# Patient Record
Sex: Female | Born: 1975 | Race: Black or African American | Hispanic: No | Marital: Single | Smoking: Current every day smoker
Health system: Southern US, Community
[De-identification: ages and names within clinical notes are randomized; demographics above are authoritative.]

## PROBLEM LIST (undated history)

## (undated) DIAGNOSIS — E7800 Pure hypercholesterolemia, unspecified: Secondary | ICD-10-CM

## (undated) DIAGNOSIS — F410 Panic disorder [episodic paroxysmal anxiety] without agoraphobia: Secondary | ICD-10-CM

## (undated) DIAGNOSIS — F329 Major depressive disorder, single episode, unspecified: Secondary | ICD-10-CM

## (undated) DIAGNOSIS — F32A Depression, unspecified: Secondary | ICD-10-CM

## (undated) DIAGNOSIS — R7303 Prediabetes: Secondary | ICD-10-CM

## (undated) DIAGNOSIS — I10 Essential (primary) hypertension: Secondary | ICD-10-CM

## (undated) DIAGNOSIS — F419 Anxiety disorder, unspecified: Secondary | ICD-10-CM

## (undated) HISTORY — PX: ABDOMINAL HYSTERECTOMY: SHX81

## (undated) HISTORY — PX: KNEE ARTHROSCOPY: SUR90

## (undated) HISTORY — PX: BREAST SURGERY: SHX581

---

## 2018-03-02 ENCOUNTER — Emergency Department (HOSPITAL_COMMUNITY): Payer: Medicaid Other

## 2018-03-02 ENCOUNTER — Encounter (HOSPITAL_COMMUNITY): Payer: Self-pay | Admitting: Emergency Medicine

## 2018-03-02 ENCOUNTER — Emergency Department (HOSPITAL_COMMUNITY)
Admission: EM | Admit: 2018-03-02 | Discharge: 2018-03-02 | Disposition: A | Discharge status: Home / Self Care | Payer: Medicaid Other | Attending: Emergency Medicine | Admitting: Emergency Medicine

## 2018-03-02 ENCOUNTER — Other Ambulatory Visit: Payer: Self-pay

## 2018-03-02 DIAGNOSIS — K009 Disorder of tooth development, unspecified: Secondary | ICD-10-CM | POA: Insufficient documentation

## 2018-03-02 DIAGNOSIS — K0889 Other specified disorders of teeth and supporting structures: Secondary | ICD-10-CM

## 2018-03-02 DIAGNOSIS — I10 Essential (primary) hypertension: Secondary | ICD-10-CM | POA: Insufficient documentation

## 2018-03-02 DIAGNOSIS — F17210 Nicotine dependence, cigarettes, uncomplicated: Secondary | ICD-10-CM | POA: Diagnosis not present

## 2018-03-02 HISTORY — DX: Major depressive disorder, single episode, unspecified: F32.9

## 2018-03-02 HISTORY — DX: Essential (primary) hypertension: I10

## 2018-03-02 HISTORY — DX: Panic disorder (episodic paroxysmal anxiety): F41.0

## 2018-03-02 HISTORY — DX: Prediabetes: R73.03

## 2018-03-02 HISTORY — DX: Anxiety disorder, unspecified: F41.9

## 2018-03-02 HISTORY — DX: Depression, unspecified: F32.A

## 2018-03-02 HISTORY — DX: Pure hypercholesterolemia, unspecified: E78.00

## 2018-03-02 MED ORDER — IBUPROFEN 800 MG PO TABS
800.0000 mg | ORAL_TABLET | Freq: Once | ORAL | Status: AC
  Administered 2018-03-02: 800 mg via ORAL
  Filled 2018-03-02: qty 1

## 2018-03-02 MED ORDER — IBUPROFEN 600 MG PO TABS: 600.0000 mg | ORAL_TABLET | Freq: Four times a day (QID) | ORAL | 0 refills | Status: AC

## 2018-03-02 MED ORDER — CLINDAMYCIN HCL 150 MG PO CAPS: 150.0000 mg | ORAL_CAPSULE | Freq: Four times a day (QID) | ORAL | 0 refills | Status: AC

## 2018-03-02 MED ORDER — CLINDAMYCIN HCL 150 MG PO CAPS
300.0000 mg | ORAL_CAPSULE | Freq: Once | ORAL | Status: AC
  Administered 2018-03-02: 300 mg via ORAL
  Filled 2018-03-02: qty 2

## 2018-03-02 MED ORDER — HYDROCODONE-ACETAMINOPHEN 5-325 MG PO TABS: 1.0000 | ORAL_TABLET | ORAL | 0 refills | Status: AC | PRN

## 2018-03-02 MED ORDER — ACETAMINOPHEN 500 MG PO TABS
1000.0000 mg | ORAL_TABLET | Freq: Once | ORAL | Status: AC
  Administered 2018-03-02: 1000 mg via ORAL
  Filled 2018-03-02: qty 2

## 2018-03-02 NOTE — Discharge Instructions (Signed)
Your vital signs have been reviewed.  Your oxygen level is 100% on room air.  Within normal limits by my interpretation.  Your CT scan shows that there is some fluid in the area where that you had the tooth removed.  There is no evidence of surrounding abscess at this time.  Please use clindamycin and ibuprofen with breakfast, lunch, dinner, and at bedtime.  Please use Norco for more severe pain.  This medication may cause drowsiness.  Please use it with caution.  Please see a local dentist as soon as possible concerning your facial pain and your dental pain.

## 2018-03-02 NOTE — ED Triage Notes (Signed)
Patient c/o left jaw pain that radiates into face. Per patient had tooth pulled 1 week and 2 days ago after cracking it while eating a jolly rancher. Patient states now unable to eat anything due to pain. Patient taking tylenol and percocet with no relief.

## 2018-03-02 NOTE — ED Provider Notes (Signed)
[facility] EMERGENCY DEPARTMENT Provider Note   CSN: [id number] Arrival date & time: 03/02/18  1248     History   Chief Complaint Chief Complaint  Patient presents with  . Jaw Pain    HPI Pamela West is a 42 y.o. female.  Patient is a 42 year old female who presents to the emergency department with a complaint of left jaw pain.  The patient states that approximately a week ago she had a molar removed.  [place]  Since she is been here she has been having pain and swelling at the area where the tooth was extracted, she is also had pain and tightness in her face.  She does not have any difficulty with breathing, she has problems with eating and chewing, but not swallowing.  She has taken Tylenol and ibuprofen without any significant effect.  She is tried ice, and she is tried some salt water swishes, but these are not helping.  She denies fever or chills.  She presents now for assistance with this issue.  The history is provided by the patient.    Past Medical History:  Diagnosis Date  . Anxiety   . Borderline diabetes   . Depression   . High cholesterol   . Hypertension   . Panic attack     There are no active problems to display for this patient.   Past Surgical History:  Procedure Laterality Date  . ABDOMINAL HYSTERECTOMY    . BREAST SURGERY    . KNEE ARTHROSCOPY       OB History   None      Home Medications    Prior to Admission medications   Not on File    Family History Family History  Problem Relation Age of Onset  . Hyperlipidemia Mother   . Hypertension Mother   . Hypertension Father   . Hyperlipidemia Father     Social History Social History   Tobacco Use  . Smoking status: Current Every Day Smoker    Packs/day: 1.00    Types: Cigarettes  . Smokeless tobacco: Never Used  Substance Use Topics  . Alcohol use: Never    Frequency: Never  . Drug use: Yes    Types: Marijuana     Allergies   Patient has no  known allergies.   Review of Systems Review of Systems  Constitutional: Negative for activity change.       All ROS Neg except as noted in HPI  HENT: Positive for dental problem and facial swelling. Negative for nosebleeds.   Eyes: Negative for photophobia and discharge.  Respiratory: Negative for cough, shortness of breath and wheezing.   Cardiovascular: Negative for chest pain and palpitations.  Gastrointestinal: Negative for abdominal pain and blood in stool.  Genitourinary: Negative for dysuria, frequency and hematuria.  Musculoskeletal: Negative for arthralgias, back pain and neck pain.  Skin: Negative.   Neurological: Negative for dizziness, seizures and speech difficulty.  Psychiatric/Behavioral: Negative for confusion and hallucinations.     Physical Exam Updated Vital Signs BP (!) 147/99 (BP Location: Right Arm)   Pulse 99   Temp 98.4 F (36.9 C) (Oral)   Resp 18   Ht 5\' 2"  (1.575 m)   Wt 72.6 kg   SpO2 100%   BMI 29.26 kg/m   Physical Exam  Constitutional: She is oriented to person, place, and time. She appears well-developed and well-nourished.  Non-toxic appearance.  HENT:  Head: Normocephalic.  Right Ear: Tympanic membrane and external  ear normal.  Left Ear: Tympanic membrane and external ear normal.  There is pain and swelling of the gum at the left upper molar area.  There is swelling around the gum area.  No visible abscess noted.  Patient has had a recent extraction in this area.  The airway is patent.  The uvula is in the midline.  There is no swelling under the tongue.  There is no trismus noted.  There is pain to palpation along the left face extending up to the temporomandibular joint.  There is no crepitus or deformity or problem or increased warmth of the temporomandibular joint.  Eyes: Pupils are equal, round, and reactive to light. EOM and lids are normal.  Neck: Normal range of motion. Neck supple. Carotid bruit is not present.  Cardiovascular:  Normal rate, regular rhythm, normal heart sounds, intact distal pulses and normal pulses.  Pulmonary/Chest: Breath sounds normal. No respiratory distress.  Abdominal: Soft. Bowel sounds are normal. There is no tenderness. There is no guarding.  Musculoskeletal: Normal range of motion.  Lymphadenopathy:       Head (right side): No submandibular adenopathy present.       Head (left side): No submandibular adenopathy present.    She has no cervical adenopathy.  Neurological: She is alert and oriented to person, place, and time. She has normal strength. No cranial nerve deficit or sensory deficit.  Skin: Skin is warm and dry.  Psychiatric: She has a normal mood and affect. Her speech is normal.  Nursing note and vitals reviewed.    ED Treatments / Results  Labs (all labs ordered are listed, but only abnormal results are displayed) Labs Reviewed - No data to display  EKG None  Radiology No results found.  Procedures Procedures (including critical care time)  Medications Ordered in ED Medications - No data to display   Initial Impression / Assessment and Plan / ED Course  I have reviewed the triage vital signs and the nursing notes.  Pertinent labs & imaging results that were available during my care of the patient were reviewed by me and considered in my medical decision making (see chart for details).       Final Clinical Impressions(s) / ED Diagnoses MDM  Vital signs reviewed.  Pulse oximetry is 100% on room air.  Within normal limits by my interpretation.  The patient has pain and swelling of the left posterior gum area.  There is no evidence for Ludewig's angina at this time, or other emergent changes.  The patient will have a CT scan of the maxillofacial area for further evaluation of possible abscess, or bone involvement following tooth extraction.  CT maxillofacial scanning reveals dental extraction at tooth #15 with a small amount of fluid and air at the extraction  bed.  There is some nonspecific mixed lucencies and calcific density of the mandible.  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  I have discussed the findings of the exam as well as the CT scan with the patient in terms which she understands.  The patient will will start on clindamycin, ibuprofen, and hydrocodone.  I have asked the patient to see a local dentist as soon as possible for evaluation of her pain and swelling following the extraction.  Patient is in agreement with this plan.   Final diagnoses:  Pain, dental  Dental anomaly    ED Discharge Orders         Ordered    clindamycin (CLEOCIN) 150 MG  capsule  Every 6 hours     03/02/18 1435    ibuprofen (ADVIL,MOTRIN) 600 MG tablet  4 times daily     03/02/18 1435    HYDROcodone-acetaminophen (NORCO/VICODIN) 5-325 MG tablet  Every 4 hours PRN     03/02/18 1435           Ivery Quale, PA-C 03/02/18 1455    Terrilee Files, MD 03/02/18 [phone]

## 2019-02-16 ENCOUNTER — Emergency Department (HOSPITAL_COMMUNITY): Payer: Medicaid Other

## 2019-02-16 ENCOUNTER — Encounter (HOSPITAL_COMMUNITY): Payer: Self-pay

## 2019-02-16 ENCOUNTER — Emergency Department (HOSPITAL_COMMUNITY)
Admission: EM | Admit: 2019-02-16 | Discharge: 2019-02-16 | Disposition: A | Discharge status: Home / Self Care | Payer: Medicaid Other | Attending: Emergency Medicine | Admitting: Emergency Medicine

## 2019-02-16 ENCOUNTER — Other Ambulatory Visit: Payer: Self-pay

## 2019-02-16 DIAGNOSIS — I10 Essential (primary) hypertension: Secondary | ICD-10-CM | POA: Diagnosis not present

## 2019-02-16 DIAGNOSIS — S0990XA Unspecified injury of head, initial encounter: Secondary | ICD-10-CM | POA: Insufficient documentation

## 2019-02-16 DIAGNOSIS — T424X1A Poisoning by benzodiazepines, accidental (unintentional), initial encounter: Secondary | ICD-10-CM | POA: Diagnosis not present

## 2019-02-16 DIAGNOSIS — R079 Chest pain, unspecified: Secondary | ICD-10-CM | POA: Diagnosis not present

## 2019-02-16 DIAGNOSIS — R270 Ataxia, unspecified: Secondary | ICD-10-CM | POA: Diagnosis not present

## 2019-02-16 DIAGNOSIS — F17210 Nicotine dependence, cigarettes, uncomplicated: Secondary | ICD-10-CM | POA: Diagnosis not present

## 2019-02-16 DIAGNOSIS — Y999 Unspecified external cause status: Secondary | ICD-10-CM | POA: Insufficient documentation

## 2019-02-16 DIAGNOSIS — Y939 Activity, unspecified: Secondary | ICD-10-CM | POA: Diagnosis not present

## 2019-02-16 DIAGNOSIS — R319 Hematuria, unspecified: Secondary | ICD-10-CM | POA: Insufficient documentation

## 2019-02-16 DIAGNOSIS — T50901A Poisoning by unspecified drugs, medicaments and biological substances, accidental (unintentional), initial encounter: Secondary | ICD-10-CM

## 2019-02-16 DIAGNOSIS — Y929 Unspecified place or not applicable: Secondary | ICD-10-CM | POA: Diagnosis not present

## 2019-02-16 LAB — I-STAT CHEM 8, ED
BUN: 18 mg/dL (ref 6–20)
Calcium, Ion: 1.13 mmol/L — ABNORMAL LOW (ref 1.15–1.40)
Chloride: 102 mmol/L (ref 98–111)
Creatinine, Ser: 0.9 mg/dL (ref 0.44–1.00)
Glucose, Bld: 89 mg/dL (ref 70–99)
HCT: 44 % (ref 36.0–46.0)
Hemoglobin: 15 g/dL (ref 12.0–15.0)
Potassium: 5.7 mmol/L — ABNORMAL HIGH (ref 3.5–5.1)
Sodium: 138 mmol/L (ref 135–145)
TCO2: 30 mmol/L (ref 22–32)

## 2019-02-16 LAB — COMPREHENSIVE METABOLIC PANEL
ALT: 27 U/L (ref 0–44)
AST: 20 U/L (ref 15–41)
Albumin: 4.1 g/dL (ref 3.5–5.0)
Alkaline Phosphatase: 91 U/L (ref 38–126)
Anion gap: 9 (ref 5–15)
BUN: 14 mg/dL (ref 6–20)
CO2: 26 mmol/L (ref 22–32)
Calcium: 9.1 mg/dL (ref 8.9–10.3)
Chloride: 102 mmol/L (ref 98–111)
Creatinine, Ser: 0.84 mg/dL (ref 0.44–1.00)
GFR calc Af Amer: >60 mL/min (ref >60)
GFR calc non Af Amer: >60 mL/min (ref >60)
Glucose, Bld: 92 mg/dL (ref 70–99)
Potassium: 3.5 mmol/L (ref 3.5–5.1)
Sodium: 137 mmol/L (ref 135–145)
Total Bilirubin: 0.1 mg/dL — ABNORMAL LOW (ref 0.3–1.2)
Total Protein: 7.4 g/dL (ref 6.5–8.1)

## 2019-02-16 LAB — CBC WITH DIFFERENTIAL/PLATELET
Abs Immature Granulocytes: 0.06 10*3/uL (ref 0.00–0.07)
Basophils Absolute: 0 10*3/uL (ref 0.0–0.1)
Basophils Relative: 0 %
Eosinophils Absolute: 0.2 10*3/uL (ref 0.0–0.5)
Eosinophils Relative: 1 %
HCT: 43.6 % (ref 36.0–46.0)
Hemoglobin: 13.2 g/dL (ref 12.0–15.0)
Immature Granulocytes: 0 %
Lymphocytes Relative: 31 %
Lymphs Abs: 5.1 10*3/uL — ABNORMAL HIGH (ref 0.7–4.0)
MCH: 28 pg (ref 26.0–34.0)
MCHC: 30.3 g/dL (ref 30.0–36.0)
MCV: 92.6 fL (ref 80.0–100.0)
Monocytes Absolute: 1.3 10*3/uL — ABNORMAL HIGH (ref 0.1–1.0)
Monocytes Relative: 8 %
Neutro Abs: 9.9 10*3/uL — ABNORMAL HIGH (ref 1.7–7.7)
Neutrophils Relative %: 60 %
Platelets: 306 10*3/uL (ref 150–400)
RBC: 4.71 MIL/uL (ref 3.87–5.11)
RDW: 13.4 % (ref 11.5–15.5)
WBC: 16.7 10*3/uL — ABNORMAL HIGH (ref 4.0–10.5)
nRBC: 0 % (ref 0.0–0.2)

## 2019-02-16 LAB — RAPID URINE DRUG SCREEN, HOSP PERFORMED
Amphetamines: NOT DETECTED
Barbiturates: NOT DETECTED
Benzodiazepines: POSITIVE — AB
Cocaine: NOT DETECTED
Opiates: NOT DETECTED
Tetrahydrocannabinol: NOT DETECTED

## 2019-02-16 LAB — I-STAT BETA HCG BLOOD, ED (MC, WL, AP ONLY): I-stat hCG, quantitative: <5 m[IU]/mL (ref <5)

## 2019-02-16 LAB — ETHANOL: Alcohol, Ethyl (B): <10 mg/dL (ref <10)

## 2019-02-16 MED ORDER — IOHEXOL 300 MG/ML  SOLN
100.0000 mL | Freq: Once | INTRAMUSCULAR | Status: AC | PRN
  Administered 2019-02-16: 100 mL via INTRAVENOUS

## 2019-02-16 MED ORDER — SODIUM CHLORIDE 0.9 % IV BOLUS
1000.0000 mL | Freq: Once | INTRAVENOUS | Status: AC
  Administered 2019-02-16: 1000 mL via INTRAVENOUS
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
## 2019-02-16 NOTE — Discharge Instructions (Addendum)
Do not take any more of your Xanax and follow-up with your doctor this week take Tylenol for pain

## 2019-02-16 NOTE — ED Notes (Signed)
Pt unable to awake enough to get her up to walk will continue to try and get awake enough to walk her

## 2019-10-21 ENCOUNTER — Ambulatory Visit: Payer: Medicare Other | Attending: Internal Medicine

## 2019-10-21 ENCOUNTER — Other Ambulatory Visit: Payer: Self-pay

## 2019-10-21 DIAGNOSIS — Z20822 Contact with and (suspected) exposure to covid-19: Secondary | ICD-10-CM

## 2019-10-22 LAB — NOVEL CORONAVIRUS, NAA: SARS-CoV-2, NAA: NOT DETECTED

## 2019-10-22 LAB — SARS-COV-2, NAA 2 DAY TAT

## 2020-03-14 ENCOUNTER — Emergency Department (HOSPITAL_COMMUNITY): Payer: Medicare Other

## 2020-03-14 ENCOUNTER — Other Ambulatory Visit: Payer: Self-pay

## 2020-03-14 ENCOUNTER — Emergency Department (HOSPITAL_COMMUNITY)
Admission: EM | Admit: 2020-03-14 | Discharge: 2020-03-14 | Disposition: A | Discharge status: Home / Self Care | Payer: Medicare Other | Attending: Emergency Medicine | Admitting: Emergency Medicine

## 2020-03-14 ENCOUNTER — Encounter (HOSPITAL_COMMUNITY): Payer: Self-pay | Admitting: Emergency Medicine

## 2020-03-14 DIAGNOSIS — I10 Essential (primary) hypertension: Secondary | ICD-10-CM | POA: Insufficient documentation

## 2020-03-14 DIAGNOSIS — Z20822 Contact with and (suspected) exposure to covid-19: Secondary | ICD-10-CM | POA: Diagnosis not present

## 2020-03-14 DIAGNOSIS — R059 Cough, unspecified: Secondary | ICD-10-CM | POA: Diagnosis present

## 2020-03-14 DIAGNOSIS — F17210 Nicotine dependence, cigarettes, uncomplicated: Secondary | ICD-10-CM | POA: Diagnosis not present

## 2020-03-14 DIAGNOSIS — J209 Acute bronchitis, unspecified: Secondary | ICD-10-CM | POA: Insufficient documentation

## 2020-03-14 LAB — RESPIRATORY PANEL BY RT PCR (FLU A&B, COVID)
Influenza A by PCR: NEGATIVE
Influenza B by PCR: NEGATIVE
SARS Coronavirus 2 by RT PCR: NEGATIVE

## 2020-03-14 MED ORDER — PREDNISONE 20 MG PO TABS: ORAL_TABLET | ORAL | 0 refills | Status: AC

## 2020-03-14 MED ORDER — ALBUTEROL SULFATE HFA 108 (90 BASE) MCG/ACT IN AERS
2.0000 | INHALATION_SPRAY | Freq: Once | RESPIRATORY_TRACT | Status: AC
  Administered 2020-03-14: 2 via RESPIRATORY_TRACT
  Filled 2020-03-14: qty 6.7

## 2020-03-14 MED ORDER — PREDNISONE 50 MG PO TABS
60.0000 mg | ORAL_TABLET | Freq: Once | ORAL | Status: AC
  Administered 2020-03-14: 60 mg via ORAL
  Filled 2020-03-14: qty 1

## 2020-03-14 NOTE — Discharge Instructions (Addendum)
If you develop high fever, severe cough or cough with blood, trouble breathing, severe headache, neck pain/stiffness, vomiting, or any other new/concerning symptoms then return to the ER for evaluation   Use the albuterol inhaler 1-2 puffs every 4 hours as needed for cough, chest tightness or wheezing  You were given prednisone today, start the prescription tomorrow

## 2020-03-14 NOTE — ED Triage Notes (Signed)
Pt arrived via POV c/o persistent cough X4 days. Pt reports Hx Bronchitis a couple years ago. Pt reports trying OTC medications without relief and is c/o chest tightness and soreness from coughing so much.

## 2020-03-14 NOTE — ED Notes (Signed)
Pt states she is going to step outside to smoke a cigarette.

## 2020-03-14 NOTE — ED Provider Notes (Signed)
[facility] EMERGENCY DEPARTMENT Provider Note   CSN: [id number] Arrival date & time: 03/14/20  1025     History Chief Complaint  Patient presents with  . Cough    Pamela West is a 44 y.o. female.  HPI 44 year old female presents with cough and chest tightness.  Ongoing for 5 days.  The cough is productive of clear sputum.  No fevers or specific chest pain besides tightness.  Sometimes she feels short of breath.  She has been using an over-the-counter inhaler with no relief.  She states she gets bronchitis from time to time.  She is a smoker.  Past Medical History:  Diagnosis Date  . Anxiety   . Borderline diabetes   . Depression   . High cholesterol   . Hypertension   . Panic attack     There are no problems to display for this patient.   Past Surgical History:  Procedure Laterality Date  . ABDOMINAL HYSTERECTOMY    . BREAST SURGERY    . KNEE ARTHROSCOPY       OB History   No obstetric history on file.     Family History  Problem Relation Age of Onset  . Hyperlipidemia Mother   . Hypertension Mother   . Hypertension Father   . Hyperlipidemia Father     Social History   Tobacco Use  . Smoking status: Current Every Day Smoker    Packs/day: 1.00    Types: Cigarettes  . Smokeless tobacco: Never Used  Vaping Use  . Vaping Use: Never used  Substance Use Topics  . Alcohol use: Never  . Drug use: Yes    Types: Marijuana    Home Medications Prior to Admission medications   Medication Sig Start Date End Date Taking? Authorizing Provider  clindamycin (CLEOCIN) 150 MG capsule Take 1 capsule (150 mg total) by mouth every 6 (six) hours. 03/02/18   Ivery Quale, PA-C  HYDROcodone-acetaminophen (NORCO/VICODIN) 5-325 MG tablet Take 1 tablet by mouth every 4 (four) hours as needed. 03/02/18   Ivery Quale, PA-C  ibuprofen (ADVIL,MOTRIN) 600 MG tablet Take 1 tablet (600 mg total) by mouth 4 (four) times daily. 03/02/18   Ivery Quale, PA-C  predniSONE  (DELTASONE) 20 MG tablet 2 tabs po daily x 4 days 03/14/20   Pricilla Loveless, MD    Allergies    Patient has no known allergies.  Review of Systems   Review of Systems  Constitutional: Negative for fever.  Respiratory: Positive for cough, chest tightness and shortness of breath.     Physical Exam Updated Vital Signs BP (!) 146/97 (BP Location: Right Arm)   Pulse 77   Temp 98.5 F (36.9 C) (Oral)   Resp 17   Ht 5\' 2"  (1.575 m)   Wt 83.5 kg   SpO2 100%   BMI 33.65 kg/m   Physical Exam Vitals and nursing note reviewed.  Constitutional:      General: She is not in acute distress.    Appearance: She is well-developed. She is not ill-appearing or diaphoretic.  HENT:     Head: Normocephalic and atraumatic.     Right Ear: External ear normal.     Left Ear: External ear normal.     Nose: Nose normal.  Eyes:     General:        Right eye: No discharge.        Left eye: No discharge.  Cardiovascular:     Rate and Rhythm: Normal rate and regular rhythm.  Heart sounds: Normal heart sounds.  Pulmonary:     Effort: Pulmonary effort is normal.     Breath sounds: Wheezing (diffuse, expiratory) present.  Abdominal:     Palpations: Abdomen is soft.     Tenderness: There is no abdominal tenderness.  Skin:    General: Skin is warm and dry.  Neurological:     Mental Status: She is alert.  Psychiatric:        Mood and Affect: Mood is not anxious.     ED Results / Procedures / Treatments   Labs (all labs ordered are listed, but only abnormal results are displayed) Labs Reviewed  RESPIRATORY PANEL BY RT PCR (FLU A&B, COVID)    EKG None  Radiology DG Chest 2 View  Result Date: 03/14/2020 CLINICAL DATA:  Cough for several days EXAM: CHEST - 2 VIEW COMPARISON:  02/16/2019 FINDINGS: Cardiac shadows within normal limits. Mild interstitial prominence is noted which may represent some mild bronchitis or residual from patient's known tobacco use. Old rib fractures are noted  with healing on the left. No focal infiltrate or effusion is seen. IMPRESSION: Mild increased interstitial markings as described. No other focal abnormality is noted. Electronically Signed   By: Alcide Clever M.D.   On: 03/14/2020 21:47    Procedures Procedures (including critical care time)  Medications Ordered in ED Medications  albuterol (VENTOLIN HFA) 108 (90 Base) MCG/ACT inhaler 2 puff (has no administration in time range)  predniSONE (DELTASONE) tablet 60 mg (has no administration in time range)    ED Course  I have reviewed the triage vital signs and the nursing notes.  Pertinent labs & imaging results that were available during my care of the patient were reviewed by me and considered in my medical decision making (see chart for details).    MDM Rules/Calculators/A&P                          Patient was tested for Covid and this is negative.  Chest x-ray shows probable bronchitis but no bacterial pneumonia.  On exam she has diffuse mild wheezing which goes along with this.  Given she is a smoker with recurrent bronchitis, I think it would be reasonable to try steroids and we will also give her an official albuterol inhaler as opposed to the over-the-counter inhaler she is using.  Otherwise she appears stable for discharge with return precautions. Final Clinical Impression(s) / ED Diagnoses Final diagnoses:  Acute bronchitis, unspecified organism    Rx / DC Orders ED Discharge Orders         Ordered    predniSONE (DELTASONE) 20 MG tablet        03/14/20 2242           Pricilla Loveless, MD 03/14/20 2253

## 2022-07-19 IMAGING — DX DG CHEST 2V
2 series · 2 of 2 positions shown · non-contrast
Comparison: 02/16/2019

CLINICAL DATA: Cough for several days

EXAM:
CHEST - 2 VIEW

[chest pa]
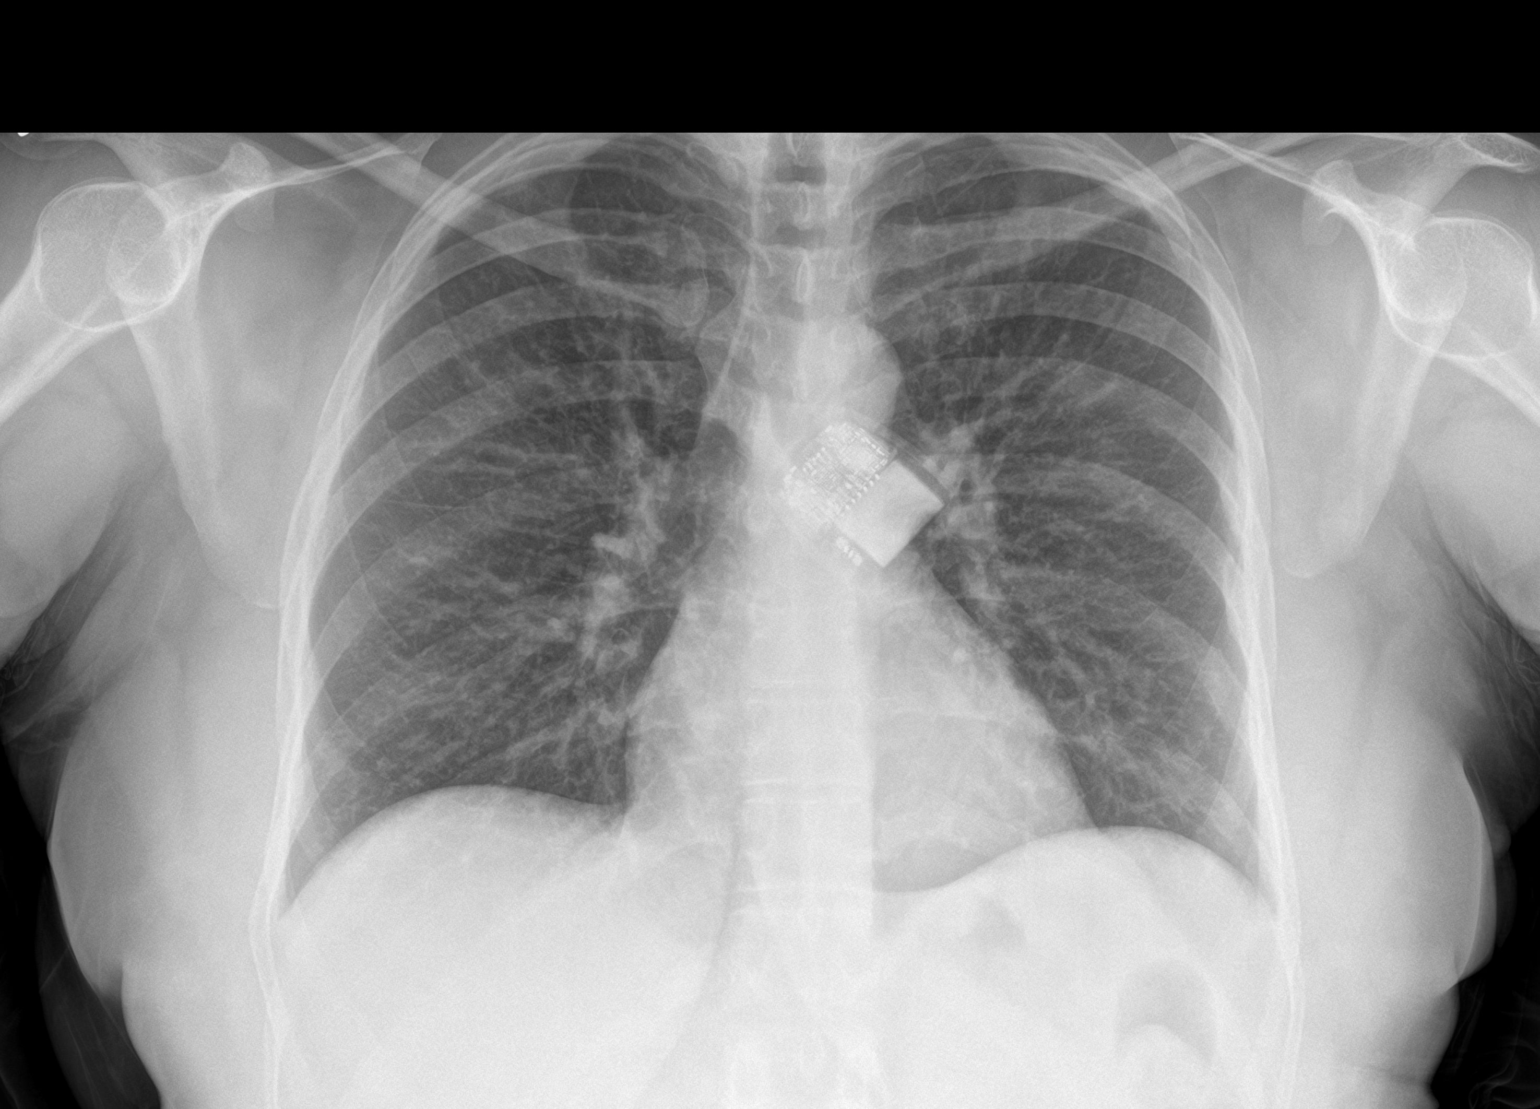

[chest lat]
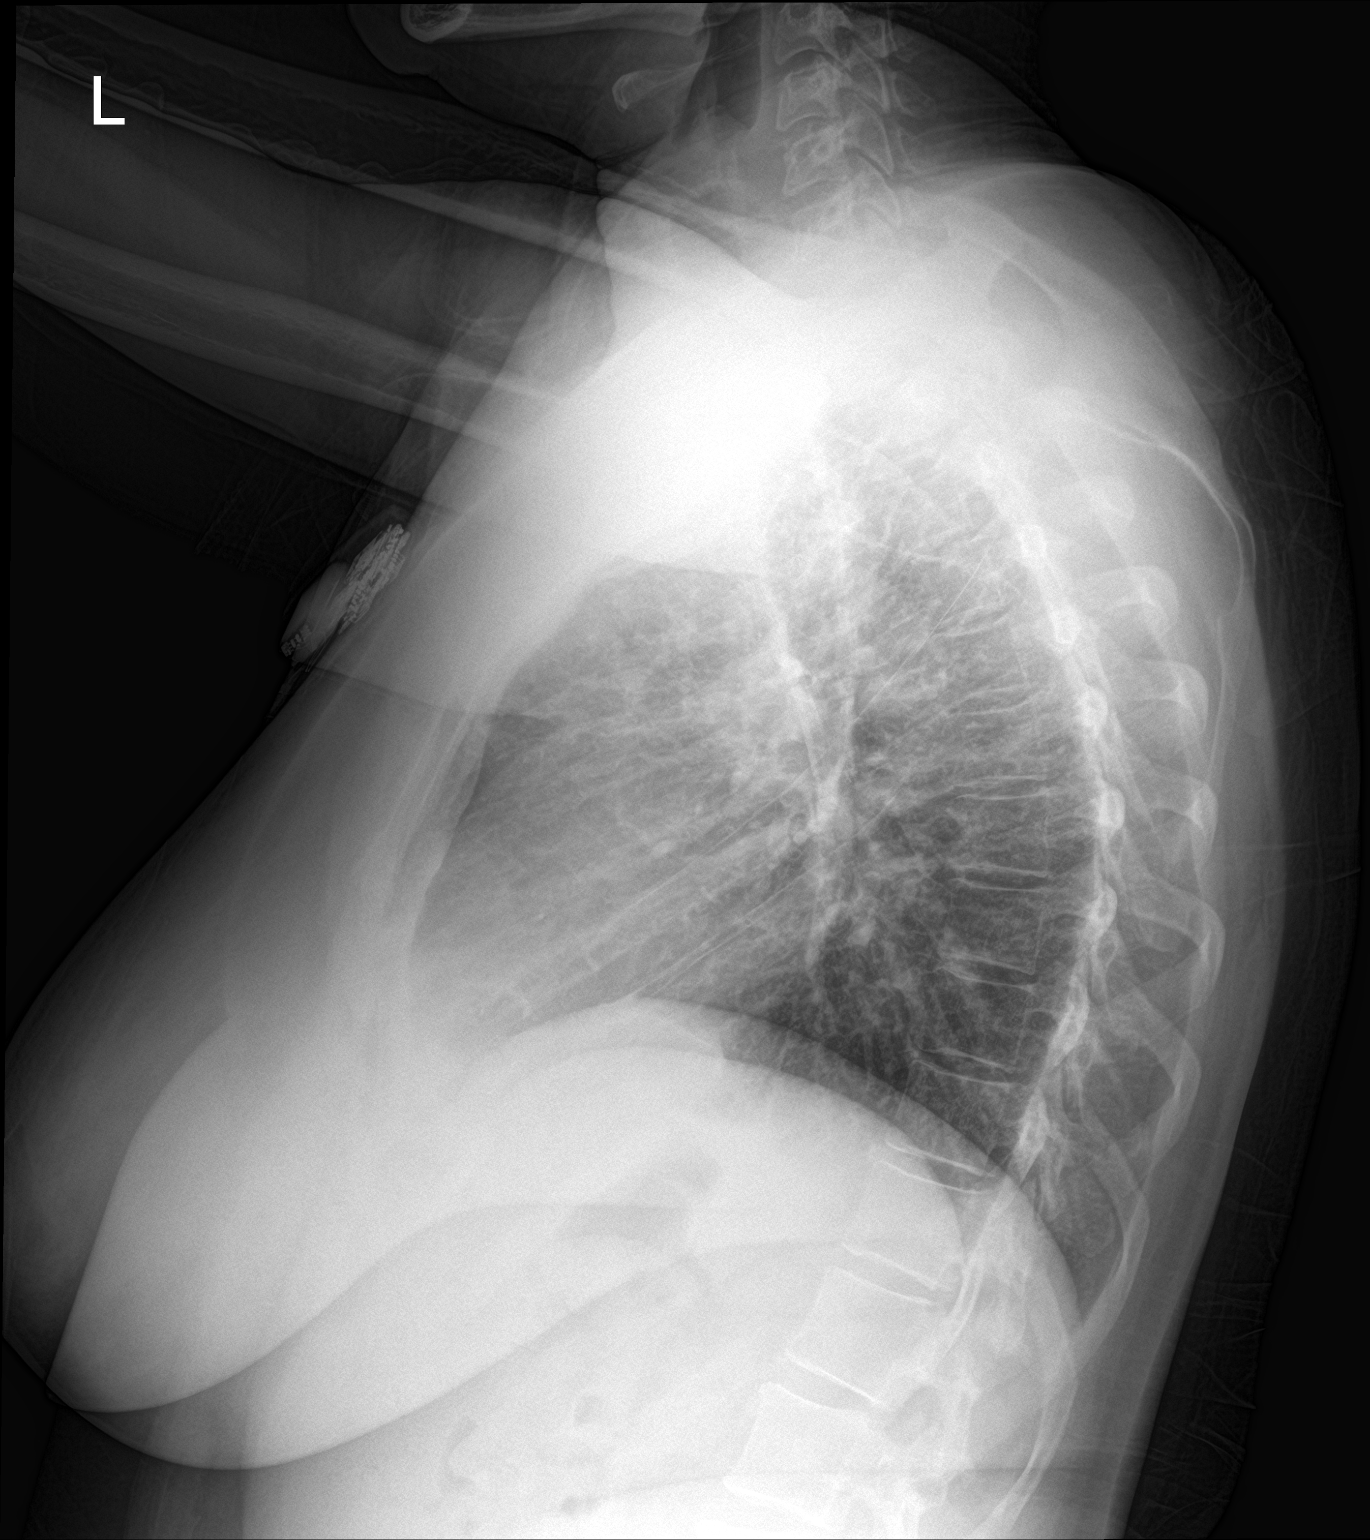

[2 of 2 positions shown; findings below may reference images not displayed]

FINDINGS: Cardiac shadows within normal limits. Mild interstitial prominence
is noted which may represent some mild bronchitis or residual from
patient's known tobacco use. Old rib fractures are noted with
healing on the left. No focal infiltrate or effusion is seen.
IMPRESSION: Mild increased interstitial markings as described. No other focal
abnormality is noted.
